# Patient Record
Sex: Male | Born: 1955
Health system: Southern US, Community
[De-identification: ages and names within clinical notes are randomized; demographics above are authoritative.]

## PROBLEM LIST (undated history)

## (undated) DIAGNOSIS — K219 Gastro-esophageal reflux disease without esophagitis: Secondary | ICD-10-CM

## (undated) DIAGNOSIS — I10 Essential (primary) hypertension: Secondary | ICD-10-CM

## (undated) DIAGNOSIS — N529 Male erectile dysfunction, unspecified: Secondary | ICD-10-CM

## (undated) DIAGNOSIS — G4700 Insomnia, unspecified: Secondary | ICD-10-CM

## (undated) HISTORY — DX: Male erectile dysfunction, unspecified: N52.9

## (undated) HISTORY — PX: TONSILLECTOMY AND ADENOIDECTOMY: SUR1326

## (undated) HISTORY — DX: Insomnia, unspecified: G47.00

---

## 2002-01-16 ENCOUNTER — Ambulatory Visit (HOSPITAL_COMMUNITY): Admission: RE | Admit: 2002-01-16 | Discharge: 2002-01-16 | Payer: Self-pay | Admitting: *Deleted

## 2002-01-16 ENCOUNTER — Encounter: Payer: Self-pay | Admitting: *Deleted

## 2014-02-06 ENCOUNTER — Encounter: Payer: Self-pay | Admitting: *Deleted

## 2014-04-29 ENCOUNTER — Emergency Department (HOSPITAL_COMMUNITY)
Admission: EM | Admit: 2014-04-29 | Discharge: 2014-04-29 | Disposition: A | Discharge status: Home / Self Care | Payer: BLUE CROSS/BLUE SHIELD | Attending: Emergency Medicine | Admitting: Emergency Medicine

## 2014-04-29 ENCOUNTER — Emergency Department (HOSPITAL_COMMUNITY): Payer: BLUE CROSS/BLUE SHIELD

## 2014-04-29 ENCOUNTER — Encounter (HOSPITAL_COMMUNITY): Payer: Self-pay

## 2014-04-29 DIAGNOSIS — Z87891 Personal history of nicotine dependence: Secondary | ICD-10-CM | POA: Insufficient documentation

## 2014-04-29 DIAGNOSIS — R202 Paresthesia of skin: Secondary | ICD-10-CM | POA: Insufficient documentation

## 2014-04-29 DIAGNOSIS — Z880 Allergy status to penicillin: Secondary | ICD-10-CM | POA: Insufficient documentation

## 2014-04-29 DIAGNOSIS — G4700 Insomnia, unspecified: Secondary | ICD-10-CM | POA: Insufficient documentation

## 2014-04-29 DIAGNOSIS — Z87438 Personal history of other diseases of male genital organs: Secondary | ICD-10-CM | POA: Insufficient documentation

## 2014-04-29 DIAGNOSIS — I10 Essential (primary) hypertension: Secondary | ICD-10-CM | POA: Insufficient documentation

## 2014-04-29 DIAGNOSIS — Z79899 Other long term (current) drug therapy: Secondary | ICD-10-CM | POA: Insufficient documentation

## 2014-04-29 LAB — CBC
HCT: 41.9 % (ref 39.0–52.0)
Hemoglobin: 14.4 g/dL (ref 13.0–17.0)
MCH: 29.2 pg (ref 26.0–34.0)
MCHC: 34.4 g/dL (ref 30.0–36.0)
MCV: 85 fL (ref 78.0–100.0)
PLATELETS: 241 10*3/uL (ref 150–400)
RBC: 4.93 MIL/uL (ref 4.22–5.81)
RDW: 13.2 % (ref 11.5–15.5)
WBC: 7 10*3/uL (ref 4.0–10.5)

## 2014-04-29 LAB — BASIC METABOLIC PANEL
Anion gap: 9 (ref 5–15)
BUN: 10 mg/dL (ref 6–23)
CHLORIDE: 103 mmol/L (ref 96–112)
CO2: 23 mmol/L (ref 19–32)
Calcium: 9.3 mg/dL (ref 8.4–10.5)
Creatinine, Ser: 0.89 mg/dL (ref 0.50–1.35)
GFR calc Af Amer: >90 mL/min (ref >90)
GFR calc non Af Amer: >90 mL/min (ref >90)
GLUCOSE: 108 mg/dL — AB (ref 70–99)
Potassium: 3.6 mmol/L (ref 3.5–5.1)
Sodium: 135 mmol/L (ref 135–145)

## 2014-04-29 LAB — URINALYSIS, ROUTINE W REFLEX MICROSCOPIC
Bilirubin Urine: NEGATIVE
Glucose, UA: NEGATIVE mg/dL
Hgb urine dipstick: NEGATIVE
Ketones, ur: NEGATIVE mg/dL
LEUKOCYTES UA: NEGATIVE
Nitrite: NEGATIVE
PROTEIN: NEGATIVE mg/dL
Specific Gravity, Urine: 1.005 (ref 1.005–1.030)
UROBILINOGEN UA: 0.2 mg/dL (ref 0.0–1.0)
pH: 6.5 (ref 5.0–8.0)

## 2014-04-29 LAB — I-STAT TROPONIN, ED: TROPONIN I, POC: 0 ng/mL (ref 0.00–0.08)

## 2014-04-29 LAB — MAGNESIUM: Magnesium: 2 mg/dL (ref 1.5–2.5)

## 2014-04-29 NOTE — Discharge Instructions (Signed)
Please follow with your primary care doctor in the next 2 days for a check-up. They must obtain records for further management.  ° °Do not hesitate to return to the Emergency Department for any new, worsening or concerning symptoms.  ° °

## 2014-04-29 NOTE — ED Notes (Addendum)
Pt presents with 2 day h/o "heat" to arms and now into chest.  Pt reports sweat palms today; denies shortness of breath.  Pt reports bilateral big toe pain, denies any discoloration or swelling in toes or legs.  Pt reports "I think my face is flushed".  Pt reports he began to take lexapro x 2 weeks ago after being off med since December.  Pt reports he walked 2 miles this morning, walked stairs at work reports activity did not impact heat sensation, did not make him short of breath.

## 2014-04-29 NOTE — ED Provider Notes (Signed)
CSN: [id number]     Arrival date & time 04/29/14  1141 History   First MD Initiated Contact with Patient 04/29/14 1201     No chief complaint on file.    (Consider location/radiation/quality/duration/timing/severity/associated sxs/prior Treatment) HPI  Patrick Griffin is a 59 y.o. male with past medical history significant for hypertension complaining of sensation of warmth to bilateral upper extremities and anterior chest onset 3 days ago. Patient denies any new medications or supplements, chest pain, shortness of breath, cough, similar prior episodes, cervicalgia, numbness, weakness. He reports that he had a erythematous rash to the anterior chest yesterday which is now resolved. Endorses chills on review of systems. Patient states that he self DC'd Lexapro at the end of December and recently restarted, approximately 2 weeks ago.  Past Medical History  Diagnosis Date  . Insomnia   . ED (erectile dysfunction)    Past Surgical History  Procedure Laterality Date  . Tonsillectomy and adenoidectomy     Family History  Problem Relation Age of Onset  . Cancer Mother   . Bipolar disorder Mother   . Melanoma Father    History  Substance Use Topics  . Smoking status: Former Games developer  . Smokeless tobacco: Not on file  . Alcohol Use: No    Review of Systems  10 systems reviewed and found to be negative, except as noted in the HPI.  Allergies  Penicillins and Shellfish allergy  Home Medications   Prior to Admission medications   Medication Sig Start Date End Date Taking? Authorizing Provider  escitalopram (LEXAPRO) 10 MG tablet Take 10 mg by mouth daily.    Historical Provider, MD  losartan (COZAAR) 50 MG tablet Take 50 mg by mouth daily.    Historical Provider, MD  zolpidem (AMBIEN CR) 12.5 MG CR tablet Take 12.5 mg by mouth at bedtime as needed for sleep.    Historical Provider, MD   BP 145/91 mmHg  Pulse 92  Temp(Src) 98.3 F (36.8 C) (Oral)  Resp 18  Ht  (1.854 m)  Wt  197 lb (89.359 kg)  BMI 26.00 kg/m2  SpO2 98% Physical Exam  Constitutional: He is oriented to person, place, and time. He appears well-developed and well-nourished. No distress.  HENT:  Head: Normocephalic.  Mouth/Throat: Oropharynx is clear and moist.  Eyes: Conjunctivae and EOM are normal. Pupils are equal, round, and reactive to light.  Neck: Normal range of motion. Neck supple. No JVD present.  Cardiovascular: Normal rate, regular rhythm and intact distal pulses.   Pulmonary/Chest: Effort normal and breath sounds normal. No stridor. No respiratory distress. He has no wheezes. He has no rales. He exhibits no tenderness.  Abdominal: Soft. Bowel sounds are normal. He exhibits no distension and no mass. There is no tenderness. There is no rebound and no guarding.  Musculoskeletal: Normal range of motion. He exhibits no edema or tenderness.  Neurological: He is alert and oriented to person, place, and time.  Follows commands, Clear, goal oriented speech, Strength is 5 out of 5x4 extremities, patient ambulates with a coordinated in nonantalgic gait. Sensation is grossly intact.   Skin: No rash noted. No erythema.  Psychiatric: He has a normal mood and affect.  Nursing note and vitals reviewed.   ED Course  Procedures (including critical care time) Labs Review Labs Reviewed - No data to display  Imaging Review No results found.   EKG Interpretation None      MDM   Final diagnoses:  Paresthesia  Filed Vitals:   04/29/14 1153 04/29/14 1215 04/29/14 1300 04/29/14 1323  BP: 145/91 149/93 140/89 139/92  Pulse: 92 100 83 89  Temp: 98.3 F (36.8 C)     TempSrc: Oral     Resp: 18 18 16 18   Height: 6\' 1"  (1.854 m)     Weight: 197 lb (89.359 kg)     SpO2: 98% 98% 99% 100%     [name] is a pleasant 59 y.o. male presenting with sensation of warmth to bilateral upper extremities and chest. This is isolated except for rash which has resolved and occasional  chills.  General cardia and left for electrolyte workup is negative. Doubt this is ACS, there does not appear to be any other neuro deficits. I've advised patient to continue taking Lexapro and follow closely with his primary care physician we've had an extensive discussion of return precautions.  Discussed case with attending MD who agrees with plan and stability to d/c to home.   Evaluation does not show pathology that would require ongoing emergent intervention or inpatient treatment. Pt is hemodynamically stable and mentating appropriately. Discussed findings and plan with patient/guardian, who agrees with care plan. All questions answered. Return precautions discussed and outpatient follow up given.       Wynetta Emeryicole Honi Name, PA-C 04/29/14 1542  Nelia Shiobert L Beaton, MD 04/30/14 [phone]

## 2017-03-15 ENCOUNTER — Emergency Department (HOSPITAL_COMMUNITY): Payer: BLUE CROSS/BLUE SHIELD

## 2017-03-15 ENCOUNTER — Emergency Department (HOSPITAL_COMMUNITY)
Admission: EM | Admit: 2017-03-15 | Discharge: 2017-03-16 | Disposition: A | Discharge status: Home / Self Care | Payer: BLUE CROSS/BLUE SHIELD | Attending: Emergency Medicine | Admitting: Emergency Medicine

## 2017-03-15 DIAGNOSIS — Z87891 Personal history of nicotine dependence: Secondary | ICD-10-CM | POA: Diagnosis not present

## 2017-03-15 DIAGNOSIS — R471 Dysarthria and anarthria: Secondary | ICD-10-CM | POA: Diagnosis not present

## 2017-03-15 DIAGNOSIS — R413 Other amnesia: Secondary | ICD-10-CM | POA: Diagnosis not present

## 2017-03-15 DIAGNOSIS — Z91040 Latex allergy status: Secondary | ICD-10-CM | POA: Diagnosis not present

## 2017-03-15 DIAGNOSIS — Z79899 Other long term (current) drug therapy: Secondary | ICD-10-CM | POA: Diagnosis not present

## 2017-03-15 DIAGNOSIS — R4781 Slurred speech: Secondary | ICD-10-CM | POA: Diagnosis present

## 2017-03-15 DIAGNOSIS — G4700 Insomnia, unspecified: Secondary | ICD-10-CM | POA: Diagnosis not present

## 2017-03-15 DIAGNOSIS — R531 Weakness: Secondary | ICD-10-CM | POA: Insufficient documentation

## 2017-03-15 LAB — COMPREHENSIVE METABOLIC PANEL
ALK PHOS: 76 U/L (ref 38–126)
ALT: 16 U/L — ABNORMAL LOW (ref 17–63)
ANION GAP: 9 (ref 5–15)
AST: 28 U/L (ref 15–41)
Albumin: 3.7 g/dL (ref 3.5–5.0)
BILIRUBIN TOTAL: 0.7 mg/dL (ref 0.3–1.2)
BUN: 18 mg/dL (ref 6–20)
CALCIUM: 8.8 mg/dL — AB (ref 8.9–10.3)
CO2: 22 mmol/L (ref 22–32)
Chloride: 103 mmol/L (ref 101–111)
Creatinine, Ser: 1.01 mg/dL (ref 0.61–1.24)
GFR calc Af Amer: >60 mL/min (ref >60)
GFR calc non Af Amer: >60 mL/min (ref >60)
Glucose, Bld: 145 mg/dL — ABNORMAL HIGH (ref 65–99)
POTASSIUM: 3.6 mmol/L (ref 3.5–5.1)
Sodium: 134 mmol/L — ABNORMAL LOW (ref 135–145)
Total Protein: 6.4 g/dL — ABNORMAL LOW (ref 6.5–8.1)

## 2017-03-15 LAB — CBC
HCT: 42.3 % (ref 39.0–52.0)
Hemoglobin: 13.9 g/dL (ref 13.0–17.0)
MCH: 28 pg (ref 26.0–34.0)
MCHC: 32.9 g/dL (ref 30.0–36.0)
MCV: 85.1 fL (ref 78.0–100.0)
Platelets: 270 10*3/uL (ref 150–400)
RBC: 4.97 MIL/uL (ref 4.22–5.81)
RDW: 13.5 % (ref 11.5–15.5)
WBC: 6.4 10*3/uL (ref 4.0–10.5)

## 2017-03-15 LAB — DIFFERENTIAL
Basophils Absolute: 0.1 10*3/uL (ref 0.0–0.1)
Basophils Relative: 1 %
EOS ABS: 0.3 10*3/uL (ref 0.0–0.7)
Eosinophils Relative: 4 %
LYMPHS PCT: 27 %
Lymphs Abs: 1.7 10*3/uL (ref 0.7–4.0)
MONO ABS: 0.6 10*3/uL (ref 0.1–1.0)
Monocytes Relative: 9 %
Neutro Abs: 3.8 10*3/uL (ref 1.7–7.7)
Neutrophils Relative %: 59 %

## 2017-03-15 LAB — PROTIME-INR
INR: 1.05
Prothrombin Time: 13.6 seconds (ref 11.4–15.2)

## 2017-03-15 LAB — I-STAT CHEM 8, ED
BUN: 22 mg/dL — ABNORMAL HIGH (ref 6–20)
CALCIUM ION: 1.16 mmol/L (ref 1.15–1.40)
Chloride: 101 mmol/L (ref 101–111)
Creatinine, Ser: 0.9 mg/dL (ref 0.61–1.24)
GLUCOSE: 149 mg/dL — AB (ref 65–99)
HCT: 43 % (ref 39.0–52.0)
Hemoglobin: 14.6 g/dL (ref 13.0–17.0)
Potassium: 3.7 mmol/L (ref 3.5–5.1)
Sodium: 138 mmol/L (ref 135–145)
TCO2: 24 mmol/L (ref 22–32)

## 2017-03-15 LAB — I-STAT TROPONIN, ED: Troponin i, poc: 0 ng/mL (ref 0.00–0.08)

## 2017-03-15 LAB — CBG MONITORING, ED
GLUCOSE-CAPILLARY: 104 mg/dL — AB (ref 65–99)
GLUCOSE-CAPILLARY: 87 mg/dL (ref 65–99)

## 2017-03-15 LAB — APTT: aPTT: 31 seconds (ref 24–36)

## 2017-03-15 MED ORDER — LORAZEPAM 2 MG/ML IJ SOLN
0.5000 mg | Freq: Once | INTRAMUSCULAR | Status: DC | PRN
  Filled 2017-03-15: qty 1

## 2017-03-15 NOTE — ED Provider Notes (Signed)
[facility] EMERGENCY DEPARTMENT Provider Note   CSN: [id number] Arrival date & time: 03/15/17  1554     History   Chief Complaint Chief Complaint  Patient presents with  . Aphasia    HPI Patrick Griffin is a 62 y.o. male.  HPI States he has been under increased stress as of late.  Had decreased sleep.  Over the last few days he has been told that his speech is different.  Has been pointed out to him that his S's are slurred.  Patient has empirically noticing changes in his speech.  Denies any focal weakness or numbness.  Denies visual changes. Past Medical History:  Diagnosis Date  . ED (erectile dysfunction)   . Insomnia     There are no active problems to display for this patient.   Past Surgical History:  Procedure Laterality Date  . TONSILLECTOMY AND ADENOIDECTOMY         Home Medications    Prior to Admission medications   Medication Sig Start Date End Date Taking? Authorizing Provider  aspirin-acetaminophen-caffeine (EXCEDRIN MIGRAINE) [phone] MG tablet Take 1 tablet by mouth every 6 (six) hours as needed for headache.   Yes [provider]  escitalopram (LEXAPRO) 10 MG tablet Take 10 mg by mouth daily.   Yes [provider]  losartan (COZAAR) 50 MG tablet Take 50 mg by mouth daily.   Yes [provider]  naproxen sodium (ALEVE) 220 MG tablet Take 220 mg by mouth 2 (two) times daily as needed (headache/pain).   Yes [provider]  omeprazole (PRILOSEC) 20 MG capsule Take 20 mg by mouth daily. 01/26/17  Yes [provider]  sildenafil (REVATIO) 20 MG tablet Take 10 mg by mouth daily as needed (erectile dysfunction).   Yes [provider]    Family History Family History  Problem Relation Age of Onset  . Cancer Mother   . Bipolar disorder Mother   . Melanoma Father     Social History Social History   Tobacco Use  . Smoking status: Former Smoker  Substance Use Topics  .  Alcohol use: No  . Drug use: No     Allergies   Latex; Penicillins; and Salmon [fish allergy]   Review of Systems Review of Systems  Constitutional: Negative for chills and fever.  Eyes: Negative for photophobia and visual disturbance.  Respiratory: Negative for cough and shortness of breath.   Cardiovascular: Negative for chest pain, palpitations and leg swelling.  Gastrointestinal: Negative for abdominal pain, constipation, diarrhea, nausea and vomiting.  Musculoskeletal: Negative for back pain, gait problem, myalgias and neck pain.  Skin: Negative for rash and wound.  Neurological: Positive for speech difficulty and weakness. Negative for dizziness, light-headedness, numbness and headaches.  Psychiatric/Behavioral: Positive for sleep disturbance.  All other systems reviewed and are negative.    Physical Exam Updated Vital Signs BP (!) 135/96   Pulse 65   Temp 98.1 F (36.7 C) (Oral)   Resp 14   SpO2 99%   Physical Exam  Constitutional: He is oriented to person, place, and time. He appears well-developed and well-nourished. No distress.  HENT:  Head: Normocephalic and atraumatic.  Mouth/Throat: Oropharynx is clear and moist. No oropharyngeal exudate.  Eyes: EOM are normal. Pupils are equal, round, and reactive to light.  Neck: Normal range of motion. Neck supple.  Cardiovascular: Normal rate and regular rhythm. Exam reveals no gallop and no friction rub.  No murmur heard. Pulmonary/Chest: Effort normal and breath  sounds normal. No stridor. No respiratory distress. He has no wheezes. He has no rales. He exhibits no tenderness.  Abdominal: Soft. Bowel sounds are normal. There is no tenderness. There is no rebound and no guarding.  Musculoskeletal: Normal range of motion. He exhibits no edema or tenderness.  Neurological: He is alert and oriented to person, place, and time.  Patient is alert and oriented x3 with clear, goal oriented speech. Patient has 5/5 motor in all  extremities. Sensation is intact to light touch. Bilateral finger-to-nose is normal with no signs of dysmetria. Patient has a normal gait and walks without assistance.  Skin: Skin is warm and dry. No rash noted. He is not diaphoretic. No erythema.  Psychiatric: His behavior is normal.  Flat affect  Nursing note and vitals reviewed.    ED Treatments / Results  Labs (all labs ordered are listed, but only abnormal results are displayed) Labs Reviewed  COMPREHENSIVE METABOLIC PANEL - Abnormal; Notable for the following components:      Result Value   Sodium 134 (*)    Glucose, Bld 145 (*)    Calcium 8.8 (*)    Total Protein 6.4 (*)    ALT 16 (*)    All other components within normal limits  CBG MONITORING, ED - Abnormal; Notable for the following components:   Glucose-Capillary 104 (*)    All other components within normal limits  I-STAT CHEM 8, ED - Abnormal; Notable for the following components:   BUN 22 (*)    Glucose, Bld 149 (*)    All other components within normal limits  PROTIME-INR  APTT  CBC  DIFFERENTIAL  I-STAT TROPONIN, ED  CBG MONITORING, ED    EKG  EKG Interpretation  Date/Time:  Friday March 15 2017 16:05:48 EST Ventricular Rate:  86 PR Interval:  172 QRS Duration: 78 QT Interval:  352 QTC Calculation: 421 R Axis:   60 Text Interpretation:  Normal sinus rhythm Normal ECG Confirmed by Loren RacerYelverton, Willi Borowiak ([id number]) on 03/15/2017 5:44:49 PM       Radiology No results found.  Procedures Procedures (including critical care time)  Medications Ordered in ED Medications - No data to display   Initial Impression / Assessment and Plan / ED Course  I have reviewed the triage vital signs and the nursing notes.  Pertinent labs & imaging results that were available during my care of the patient were reviewed by me and considered in my medical decision making (see chart for details).     Discussed with neurology who recommends MRI. Signed out to oncoming  emergency physician pending MRI. Final Clinical Impressions(s) / ED Diagnoses   Final diagnoses:  Dysarthria    ED Discharge Orders    None       Loren RacerYelverton, Kenon Delashmit, MD 03/19/17 [phone]

## 2017-03-15 NOTE — ED Notes (Signed)
Pt requesting to leave, Dr. Ranae PalmsYelverton notified

## 2017-03-15 NOTE — ED Triage Notes (Signed)
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

## 2017-03-16 ENCOUNTER — Emergency Department (HOSPITAL_COMMUNITY): Payer: BLUE CROSS/BLUE SHIELD

## 2017-03-16 NOTE — Discharge Instructions (Signed)
You were seen in the emergency department for changes in your speech.  Your labs, EKG and MRI of your brain today were normal.  No sign of stroke on MRI.  I recommend close follow-up with your primary care physician if symptoms continue.

## 2017-03-16 NOTE — ED Notes (Signed)
Signature pad broken pt stable and ambulatory for d/c. States understanding d/c instructions

## 2017-03-16 NOTE — ED Provider Notes (Signed)
12:00 AM  Assumed care from Dr. Ranae PalmsYelverton.  Patient is a 62 year old male who presents emergency department with slurred speech for the past several days.  CT of the head showed subtle area of hypoattenuation in the left frontal lobe which may be artifact versus age-indeterminate ischemic infarction.  MRI of the brain ordered.  Neurology has been negative, patient will be discharged home.  Normal neurologic exam here in the emergency department.   1:20 AM  Pt's brain MRI is negative.  Will discharge home.  He has no slurred speech or aphasia on exam.  He is comfortable with this plan at and can follow-up with his outpatient provider.  Labs, EKG unremarkable.   At this time, I do not feel there is any life-threatening condition present. I have reviewed and discussed all results (EKG, imaging, lab, urine as appropriate) and exam findings with patient/family. I have reviewed nursing notes and appropriate previous records.  I feel the patient is safe to be discharged home without further emergent workup and can continue workup as an outpatient as needed. Discussed usual and customary return precautions. Patient/family verbalize understanding and are comfortable with this plan.  Outpatient follow-up has been provided if needed. All questions have been answered.    Patrick Griffin, Patrick MawKristen N, DO 03/16/17 0122

## 2018-10-01 ENCOUNTER — Encounter (HOSPITAL_COMMUNITY): Payer: Self-pay | Admitting: Emergency Medicine

## 2018-10-01 ENCOUNTER — Other Ambulatory Visit: Payer: Self-pay

## 2018-10-01 ENCOUNTER — Emergency Department (HOSPITAL_COMMUNITY)
Admission: EM | Admit: 2018-10-01 | Discharge: 2018-10-01 | Disposition: A | Discharge status: Home / Self Care | Payer: Managed Care, Other (non HMO) | Attending: Emergency Medicine | Admitting: Emergency Medicine

## 2018-10-01 DIAGNOSIS — I10 Essential (primary) hypertension: Secondary | ICD-10-CM | POA: Insufficient documentation

## 2018-10-01 DIAGNOSIS — Z87891 Personal history of nicotine dependence: Secondary | ICD-10-CM | POA: Insufficient documentation

## 2018-10-01 DIAGNOSIS — H9203 Otalgia, bilateral: Secondary | ICD-10-CM

## 2018-10-01 DIAGNOSIS — R519 Headache, unspecified: Secondary | ICD-10-CM

## 2018-10-01 DIAGNOSIS — U071 COVID-19: Secondary | ICD-10-CM | POA: Insufficient documentation

## 2018-10-01 DIAGNOSIS — Z91040 Latex allergy status: Secondary | ICD-10-CM | POA: Insufficient documentation

## 2018-10-01 DIAGNOSIS — R51 Headache: Secondary | ICD-10-CM | POA: Diagnosis present

## 2018-10-01 DIAGNOSIS — Z79899 Other long term (current) drug therapy: Secondary | ICD-10-CM | POA: Diagnosis not present

## 2018-10-01 HISTORY — DX: Gastro-esophageal reflux disease without esophagitis: K21.9

## 2018-10-01 HISTORY — DX: Essential (primary) hypertension: I10

## 2018-10-01 NOTE — Discharge Instructions (Signed)
Try Flonase or Afrin over the counter Continue Ibuprofen or Tylenol for pain and fever You can also try Benadryl for congestion and allergies Please continue to self-isolate, rest, and drink fluids. Return if worsening

## 2018-10-01 NOTE — ED Provider Notes (Signed)
[facility]-EMERGENCY DEPT Provider Note   CSN: [id number] Arrival date & time: 10/01/18  1037     History   Chief Complaint Chief Complaint  Patient presents with  . Covid +  . Headache  . Otalgia    HPI Patrick Griffin is a 63 y.o. male who presents with bilateral ear pain.  Patient was recently diagnosed with COVID-19 and he states that since then he has felt severe fatigue and over the past couple days he has a "searing" headache over the bilateral temples and bilateral ear pain.  He feels like this is due to congestion.  He has not taken anything for his symptoms.  He tried to go to his primary doctor but they would not see him because of COVID.  He denies chest pain, shortness of breath, coughing, abdominal pain, nausea or diarrhea.  Additionally he reports a rash on his back.  He states it is mildly itchy.  No new soaps, lotions, detergents.     HPI  Past Medical History:  Diagnosis Date  . Acid reflux   . ED (erectile dysfunction)   . Hypertension   . Insomnia     There are no active problems to display for this patient.   Past Surgical History:  Procedure Laterality Date  . TONSILLECTOMY AND ADENOIDECTOMY          Home Medications    Prior to Admission medications   Medication Sig Start Date End Date Taking? Authorizing Provider  aspirin-acetaminophen-caffeine (EXCEDRIN MIGRAINE) [phone] MG tablet Take 1 tablet by mouth every 6 (six) hours as needed for headache.    [provider]  escitalopram (LEXAPRO) 10 MG tablet Take 10 mg by mouth daily.    [provider]  losartan (COZAAR) 50 MG tablet Take 50 mg by mouth daily.    [provider]  naproxen sodium (ALEVE) 220 MG tablet Take 220 mg by mouth 2 (two) times daily as needed (headache/pain).    [provider]  omeprazole (PRILOSEC) 20 MG capsule Take 20 mg by mouth daily. 01/26/17   [provider]  sildenafil (REVATIO) 20 MG tablet Take  10 mg by mouth daily as needed (erectile dysfunction).    [provider]    Family History Family History  Problem Relation Age of Onset  . Cancer Mother   . Bipolar disorder Mother   . Melanoma Father     Social History Social History   Tobacco Use  . Smoking status: Former Games developermoker  . Smokeless tobacco: Never Used  Substance Use Topics  . Alcohol use: Yes  . Drug use: No     Allergies   Latex, Penicillins, and Salmon [fish allergy]   Review of Systems Review of Systems  Constitutional: Positive for fatigue and fever.  HENT: Positive for congestion and ear pain. Negative for sore throat.   Respiratory: Negative for cough and shortness of breath.   Cardiovascular: Negative for chest pain.  Gastrointestinal: Negative for abdominal pain, diarrhea and nausea.  Neurological: Positive for headaches.  All other systems reviewed and are negative.    Physical Exam Updated Vital Signs BP (!) 125/91 (BP Location: Left Arm)   Pulse (!) 107   Temp 100 F (37.8 C) (Oral)   Resp 20   SpO2 98%   Physical Exam Vitals signs and nursing note reviewed.  Constitutional:      General: He is not in acute distress.    Appearance: He is well-developed. He is not  ill-appearing.  HENT:     Head: Normocephalic and atraumatic.     Right Ear: Hearing, tympanic membrane, ear canal and external ear normal.     Left Ear: Hearing, ear canal and external ear normal. A middle ear effusion is present.     Nose: Nose normal.     Mouth/Throat:     Lips: Pink.     Mouth: Mucous membranes are moist.  Eyes:     General: No scleral icterus.       Right eye: No discharge.        Left eye: No discharge.     Conjunctiva/sclera: Conjunctivae normal.     Pupils: Pupils are equal, round, and reactive to light.  Neck:     Musculoskeletal: Normal range of motion.  Cardiovascular:     Rate and Rhythm: Regular rhythm. Tachycardia present.  Pulmonary:     Effort: Pulmonary effort is  normal. No respiratory distress.     Breath sounds: Normal breath sounds.  Abdominal:     General: There is no distension.  Skin:    General: Skin is warm and dry.     Findings: Rash (Diffuse papular rash that looks like folliculitis.) present.  Neurological:     Mental Status: He is alert and oriented to person, place, and time.  Psychiatric:        Behavior: Behavior normal.      ED Treatments / Results  Labs (all labs ordered are listed, but only abnormal results are displayed) Labs Reviewed - No data to display  EKG None  Radiology No results found.  Procedures Procedures (including critical care time)  Medications Ordered in ED Medications - No data to display   Initial Impression / Assessment and Plan / ED Course  I have reviewed the triage vital signs and the nursing notes.  Pertinent labs & imaging results that were available during my care of the patient were reviewed by me and considered in my medical decision making (see chart for details).  63 year old male with COVID-19 presents with bilateral ear pain and a headache.  Temp is elevated and he is mildly tachycardic.  He appears generally fatigued.  HEENT exam reveals a middle ear effusion.  He denies any respiratory symptoms.  Do not think we need a chest x-ray at this time.  O2 sats are 98%.  He is encouraged to get Flonase and antihistamine and continue to self isolate, rest, drink fluids.  Return precautions discussed.  Final Clinical Impressions(s) / ED Diagnoses   Final diagnoses:  Bad headache  Otalgia of both ears    ED Discharge Orders    None       Recardo Evangelist, PA-C 10/01/18 Ansted, Adam, DO 10/01/18 1528

## 2018-10-01 NOTE — ED Triage Notes (Addendum)
Pt reports he was called on Sunday with a Covid + test. C/o headache and ear pains. Traveled to [place] before his symptoms started. Pt reports taken Goody's for his headaches and fevers which will relieve it.

## 2019-06-21 IMAGING — CT CT HEAD W/O CM
4 series · 17 of 47 positions shown, 19 images · non-contrast
Comparison: None.

CLINICAL DATA: Focal neuro deficit for more than 6 hours. Suspected
stroke.

EXAM:
CT HEAD WITHOUT CONTRAST
TECHNIQUE: Contiguous axial images were obtained from the base of the skull
through the vertex without intravenous contrast.

[Series 3: head without · axial · non-contrast · 0.44mm/px · z∈[-96,+29]mm · 7 of 35 slices shown, 9 images]
[im 5/35  brain]
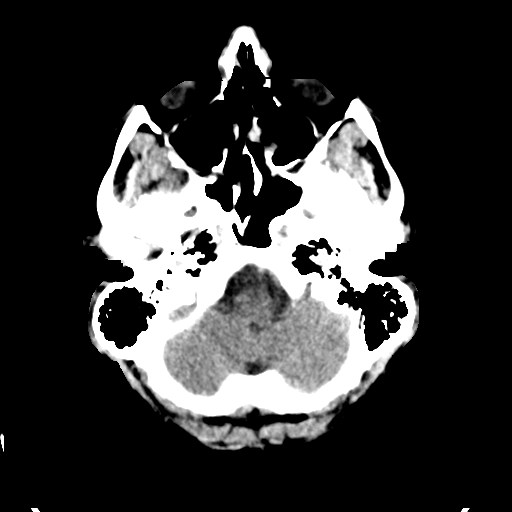
[im 5/35  bone]
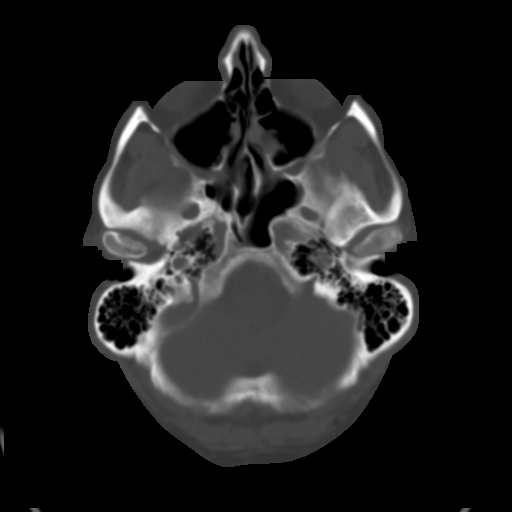
[im 9/35  brain]
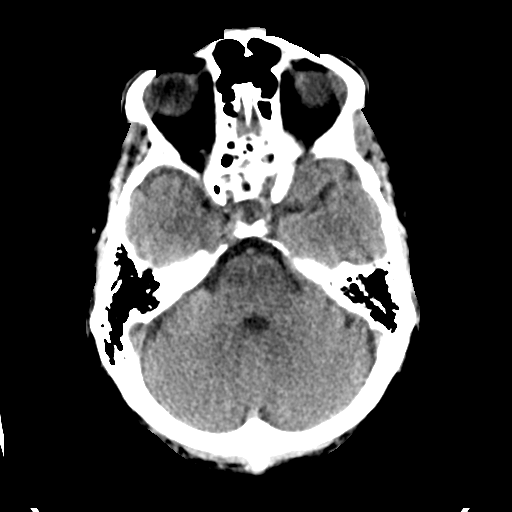
[im 13/35  brain]
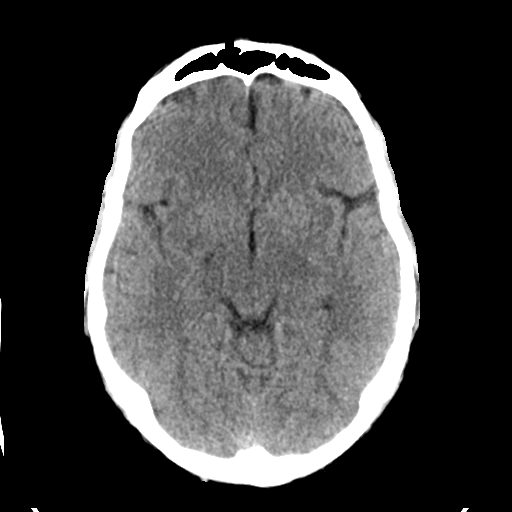
[im 18/35  brain]
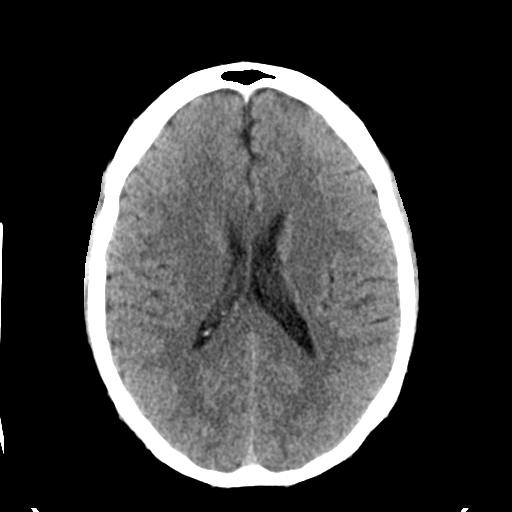
[im 22/35  brain]
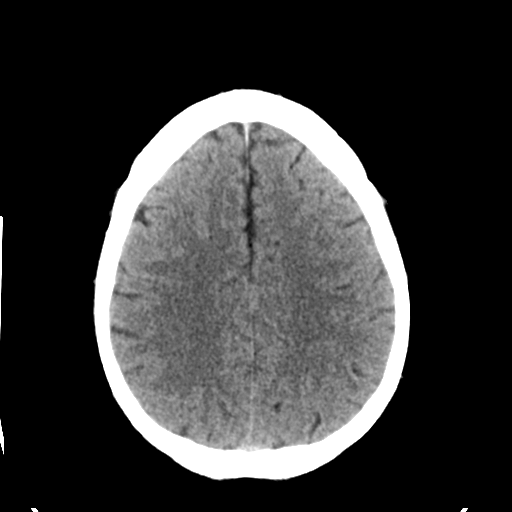
[im 22/35  bone]
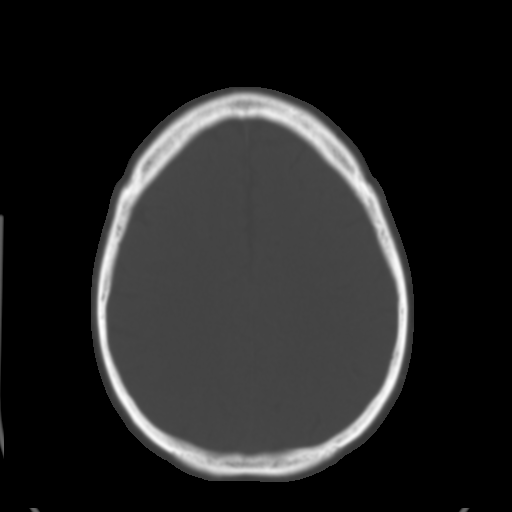
[im 26/35  brain]
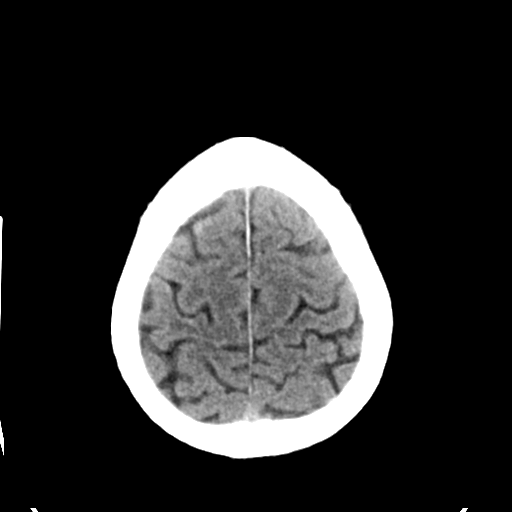
[im 30/35  brain]
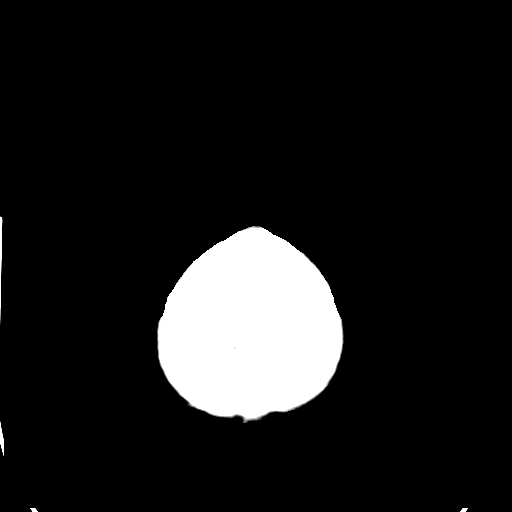

[Series 4: head bone · axial · 0.44mm/px · z∈[-100,-40]mm · 4 of 86 slices shown]
[im 9/86  bone]
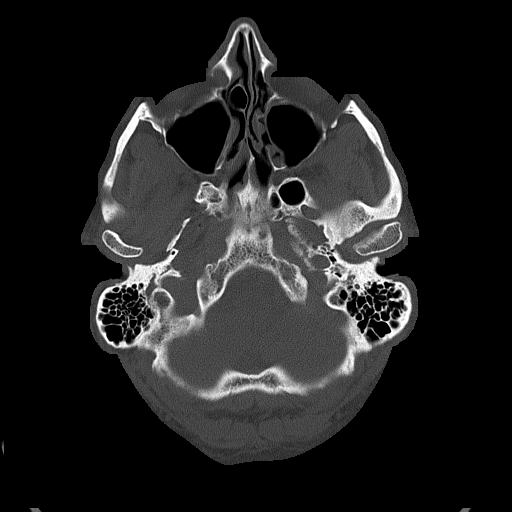
[im 18/86  bone]
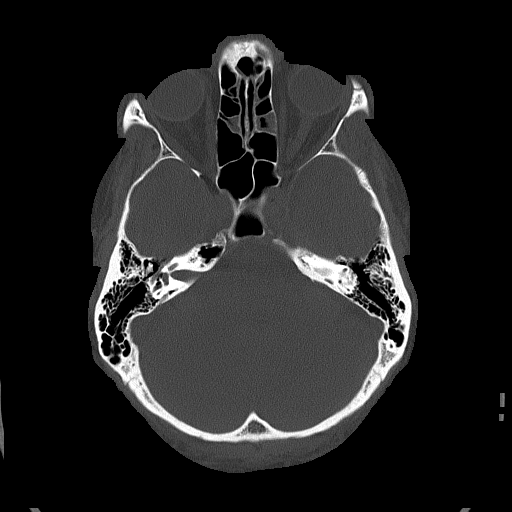
[im 26/86  bone]
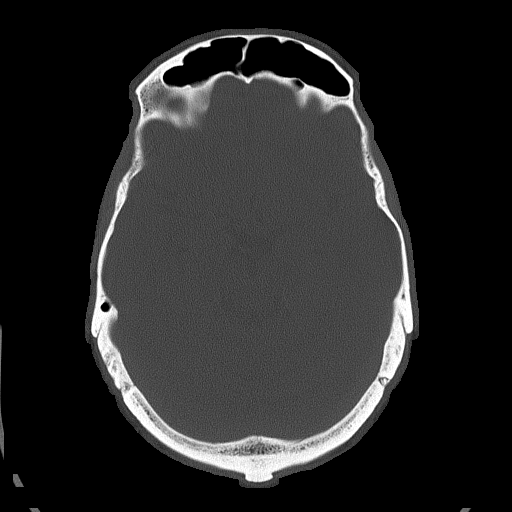
[im 39/86  bone]
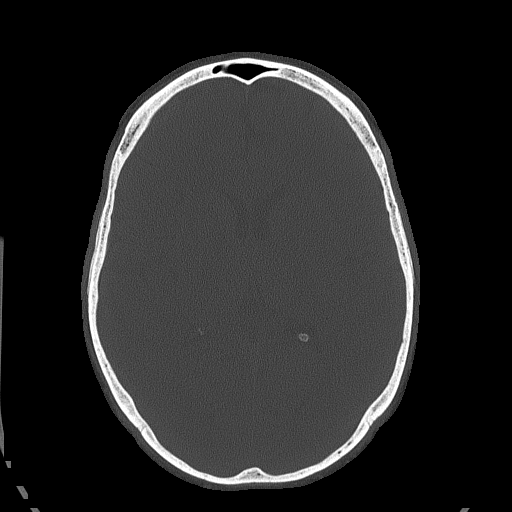

[Series 5: head without cor · coronal · non-contrast · 0.34mm/px · 3 of 70 slices shown]
[im 24/70  brain]
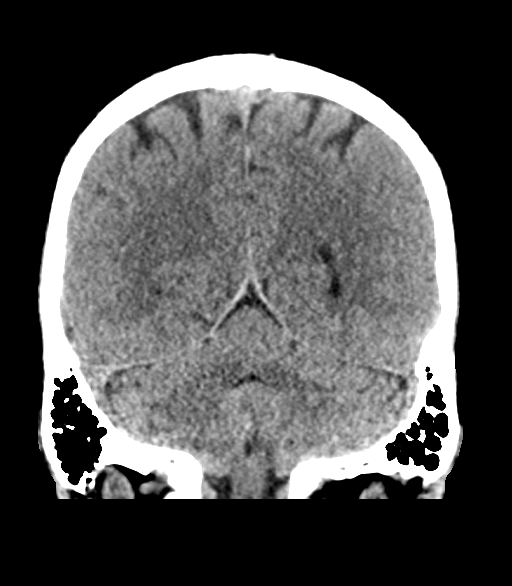
[im 31/70  brain]
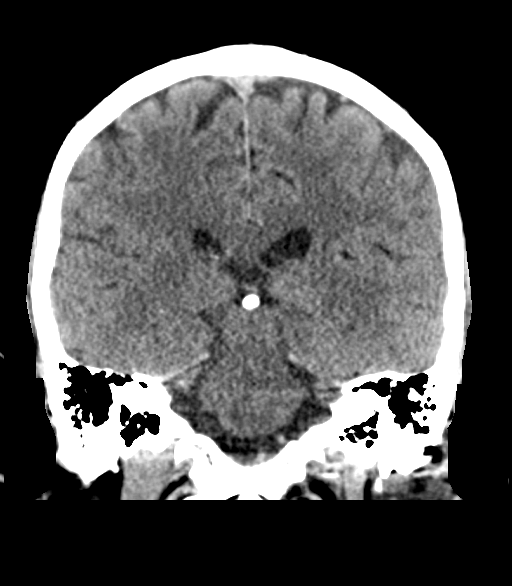
[im 39/70  brain]
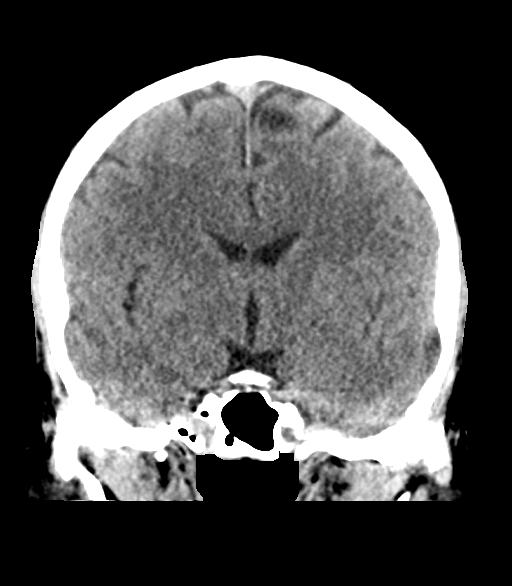

[Series 6: head without sag · sagittal · non-contrast · 0.36mm/px · 3 of 67 slices shown]
[im 23/67  brain]
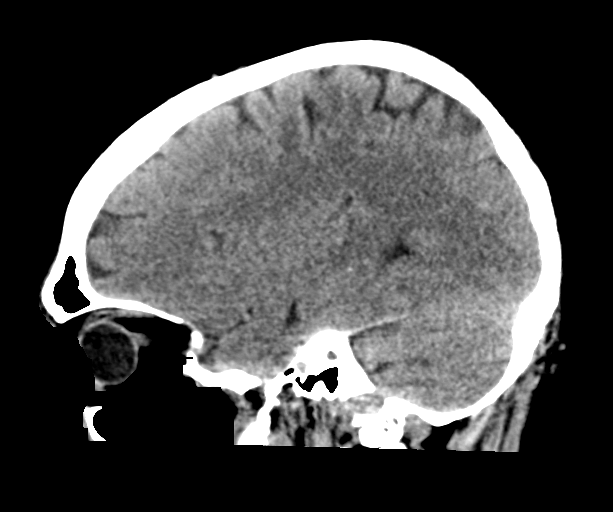
[im 34/67  brain]
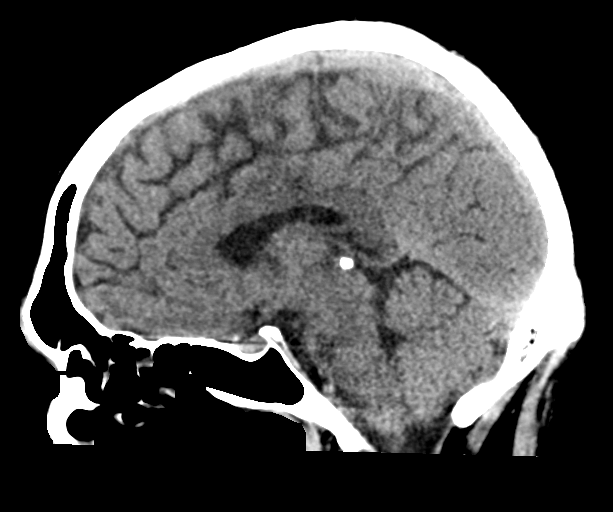
[im 45/67  brain]
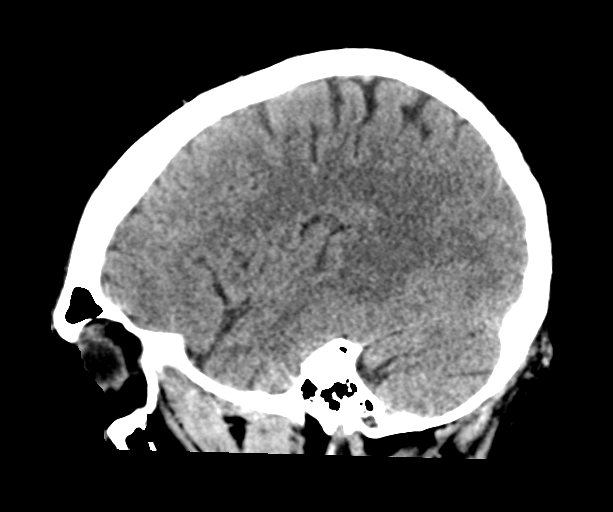

[17 of 47 positions shown; findings below may reference images not displayed]

FINDINGS: Brain: No evidence of acute hemorrhage, hydrocephalus, extra-axial
collection or mass lesion/mass effect. Questionable subtle area of
hypoattenuation in the posterior left frontal lobe in mostly
subcortical distribution. Mild periventricular microangiopathy.

Vascular: No hyperdense vessel or unexpected calcification.

Skull: Normal. Negative for fracture or focal lesion.

Sinuses/Orbits: Partial opacification of the ethmoid sinuses. The
remainder of paranasal sinuses are well aerated.

Other: None.
IMPRESSION: Subtle area of hypoattenuation in the left frontal lobe, which may
be artifactual or represent an age indeterminate ischemic
infarction.

No evidence of intracranial hemorrhage or mass effect.

## 2022-01-16 ENCOUNTER — Other Ambulatory Visit: Payer: Self-pay | Admitting: Family Medicine

## 2022-01-16 DIAGNOSIS — E7800 Pure hypercholesterolemia, unspecified: Secondary | ICD-10-CM | POA: Diagnosis not present

## 2022-01-16 DIAGNOSIS — F324 Major depressive disorder, single episode, in partial remission: Secondary | ICD-10-CM | POA: Diagnosis not present

## 2022-01-16 DIAGNOSIS — Z1331 Encounter for screening for depression: Secondary | ICD-10-CM | POA: Diagnosis not present

## 2022-01-16 DIAGNOSIS — Z0000 Encounter for general adult medical examination without abnormal findings: Secondary | ICD-10-CM | POA: Diagnosis not present

## 2022-01-16 DIAGNOSIS — G4733 Obstructive sleep apnea (adult) (pediatric): Secondary | ICD-10-CM | POA: Diagnosis not present

## 2022-01-16 DIAGNOSIS — K219 Gastro-esophageal reflux disease without esophagitis: Secondary | ICD-10-CM | POA: Diagnosis not present

## 2022-01-16 DIAGNOSIS — Z136 Encounter for screening for cardiovascular disorders: Secondary | ICD-10-CM

## 2022-01-16 DIAGNOSIS — N529 Male erectile dysfunction, unspecified: Secondary | ICD-10-CM | POA: Diagnosis not present

## 2022-01-16 DIAGNOSIS — I10 Essential (primary) hypertension: Secondary | ICD-10-CM | POA: Diagnosis not present

## 2022-01-16 DIAGNOSIS — Z125 Encounter for screening for malignant neoplasm of prostate: Secondary | ICD-10-CM | POA: Diagnosis not present

## 2022-01-29 ENCOUNTER — Ambulatory Visit
Admission: RE | Admit: 2022-01-29 | Discharge: 2022-01-29 | Disposition: A | Discharge status: Home / Self Care | Payer: Medicare HMO | Source: Ambulatory Visit | Attending: Family Medicine | Admitting: Family Medicine

## 2022-01-29 ENCOUNTER — Ambulatory Visit: Payer: Managed Care, Other (non HMO)

## 2022-01-29 DIAGNOSIS — Z136 Encounter for screening for cardiovascular disorders: Secondary | ICD-10-CM

## 2022-01-29 DIAGNOSIS — Z87891 Personal history of nicotine dependence: Secondary | ICD-10-CM | POA: Diagnosis not present

## 2022-07-16 DIAGNOSIS — G4700 Insomnia, unspecified: Secondary | ICD-10-CM | POA: Diagnosis not present

## 2022-07-16 DIAGNOSIS — I10 Essential (primary) hypertension: Secondary | ICD-10-CM | POA: Diagnosis not present

## 2022-07-16 DIAGNOSIS — N529 Male erectile dysfunction, unspecified: Secondary | ICD-10-CM | POA: Diagnosis not present

## 2022-07-16 DIAGNOSIS — F330 Major depressive disorder, recurrent, mild: Secondary | ICD-10-CM | POA: Diagnosis not present

## 2022-07-16 DIAGNOSIS — K219 Gastro-esophageal reflux disease without esophagitis: Secondary | ICD-10-CM | POA: Diagnosis not present

## 2022-07-16 DIAGNOSIS — G4733 Obstructive sleep apnea (adult) (pediatric): Secondary | ICD-10-CM | POA: Diagnosis not present

## 2022-07-16 DIAGNOSIS — H9190 Unspecified hearing loss, unspecified ear: Secondary | ICD-10-CM | POA: Diagnosis not present

## 2022-07-16 DIAGNOSIS — N400 Enlarged prostate without lower urinary tract symptoms: Secondary | ICD-10-CM | POA: Diagnosis not present

## 2022-07-16 DIAGNOSIS — E7800 Pure hypercholesterolemia, unspecified: Secondary | ICD-10-CM | POA: Diagnosis not present

## 2023-01-25 DIAGNOSIS — Z1159 Encounter for screening for other viral diseases: Secondary | ICD-10-CM | POA: Diagnosis not present

## 2023-01-25 DIAGNOSIS — L819 Disorder of pigmentation, unspecified: Secondary | ICD-10-CM | POA: Diagnosis not present

## 2023-01-25 DIAGNOSIS — Z0000 Encounter for general adult medical examination without abnormal findings: Secondary | ICD-10-CM | POA: Diagnosis not present

## 2023-01-25 DIAGNOSIS — F1090 Alcohol use, unspecified, uncomplicated: Secondary | ICD-10-CM | POA: Diagnosis not present

## 2023-01-25 DIAGNOSIS — Z13220 Encounter for screening for lipoid disorders: Secondary | ICD-10-CM | POA: Diagnosis not present

## 2023-01-25 DIAGNOSIS — F325 Major depressive disorder, single episode, in full remission: Secondary | ICD-10-CM | POA: Diagnosis not present

## 2023-01-25 DIAGNOSIS — I10 Essential (primary) hypertension: Secondary | ICD-10-CM | POA: Diagnosis not present

## 2023-01-25 DIAGNOSIS — K219 Gastro-esophageal reflux disease without esophagitis: Secondary | ICD-10-CM | POA: Diagnosis not present

## 2023-01-25 DIAGNOSIS — Z125 Encounter for screening for malignant neoplasm of prostate: Secondary | ICD-10-CM | POA: Diagnosis not present

## 2023-03-12 DIAGNOSIS — L814 Other melanin hyperpigmentation: Secondary | ICD-10-CM | POA: Diagnosis not present

## 2023-03-12 DIAGNOSIS — D492 Neoplasm of unspecified behavior of bone, soft tissue, and skin: Secondary | ICD-10-CM | POA: Diagnosis not present

## 2023-03-12 DIAGNOSIS — R202 Paresthesia of skin: Secondary | ICD-10-CM | POA: Diagnosis not present

## 2023-03-12 DIAGNOSIS — L538 Other specified erythematous conditions: Secondary | ICD-10-CM | POA: Diagnosis not present

## 2023-03-12 DIAGNOSIS — D225 Melanocytic nevi of trunk: Secondary | ICD-10-CM | POA: Diagnosis not present

## 2023-03-12 DIAGNOSIS — L821 Other seborrheic keratosis: Secondary | ICD-10-CM | POA: Diagnosis not present

## 2023-04-03 DIAGNOSIS — N401 Enlarged prostate with lower urinary tract symptoms: Secondary | ICD-10-CM | POA: Diagnosis not present

## 2023-04-03 DIAGNOSIS — M1990 Unspecified osteoarthritis, unspecified site: Secondary | ICD-10-CM | POA: Diagnosis not present

## 2023-04-03 DIAGNOSIS — Z860100 Personal history of colon polyps, unspecified: Secondary | ICD-10-CM | POA: Diagnosis not present

## 2023-04-03 DIAGNOSIS — E785 Hyperlipidemia, unspecified: Secondary | ICD-10-CM | POA: Diagnosis not present

## 2023-04-03 DIAGNOSIS — E663 Overweight: Secondary | ICD-10-CM | POA: Diagnosis not present

## 2023-04-03 DIAGNOSIS — K219 Gastro-esophageal reflux disease without esophagitis: Secondary | ICD-10-CM | POA: Diagnosis not present

## 2023-04-03 DIAGNOSIS — R2681 Unsteadiness on feet: Secondary | ICD-10-CM | POA: Diagnosis not present

## 2023-04-03 DIAGNOSIS — Z008 Encounter for other general examination: Secondary | ICD-10-CM | POA: Diagnosis not present

## 2023-04-03 DIAGNOSIS — F17211 Nicotine dependence, cigarettes, in remission: Secondary | ICD-10-CM | POA: Diagnosis not present

## 2023-04-03 DIAGNOSIS — Z6825 Body mass index (BMI) 25.0-25.9, adult: Secondary | ICD-10-CM | POA: Diagnosis not present

## 2023-04-03 DIAGNOSIS — I10 Essential (primary) hypertension: Secondary | ICD-10-CM | POA: Diagnosis not present

## 2023-04-03 DIAGNOSIS — F411 Generalized anxiety disorder: Secondary | ICD-10-CM | POA: Diagnosis not present

## 2023-07-22 DIAGNOSIS — R1013 Epigastric pain: Secondary | ICD-10-CM | POA: Diagnosis not present

## 2023-07-22 DIAGNOSIS — F411 Generalized anxiety disorder: Secondary | ICD-10-CM | POA: Diagnosis not present

## 2023-07-22 DIAGNOSIS — I10 Essential (primary) hypertension: Secondary | ICD-10-CM | POA: Diagnosis not present

## 2023-08-07 DIAGNOSIS — K219 Gastro-esophageal reflux disease without esophagitis: Secondary | ICD-10-CM | POA: Diagnosis not present

## 2023-09-11 DIAGNOSIS — K317 Polyp of stomach and duodenum: Secondary | ICD-10-CM | POA: Diagnosis not present

## 2023-09-11 DIAGNOSIS — K2940 Chronic atrophic gastritis without bleeding: Secondary | ICD-10-CM | POA: Diagnosis not present

## 2023-09-11 DIAGNOSIS — K31A19 Gastric intestinal metaplasia without dysplasia, unspecified site: Secondary | ICD-10-CM | POA: Diagnosis not present

## 2023-09-11 DIAGNOSIS — K2930 Chronic superficial gastritis without bleeding: Secondary | ICD-10-CM | POA: Diagnosis not present

## 2023-09-11 DIAGNOSIS — K2289 Other specified disease of esophagus: Secondary | ICD-10-CM | POA: Diagnosis not present

## 2023-09-11 DIAGNOSIS — K219 Gastro-esophageal reflux disease without esophagitis: Secondary | ICD-10-CM | POA: Diagnosis not present

## 2023-09-11 DIAGNOSIS — K3189 Other diseases of stomach and duodenum: Secondary | ICD-10-CM | POA: Diagnosis not present

## 2023-09-11 DIAGNOSIS — K31A15 Gastric intestinal metaplasia without dysplasia, involving multiple sites: Secondary | ICD-10-CM | POA: Diagnosis not present

## 2023-09-18 DIAGNOSIS — K219 Gastro-esophageal reflux disease without esophagitis: Secondary | ICD-10-CM | POA: Diagnosis not present

## 2023-09-18 DIAGNOSIS — K2940 Chronic atrophic gastritis without bleeding: Secondary | ICD-10-CM | POA: Diagnosis not present

## 2023-09-18 DIAGNOSIS — K31A19 Gastric intestinal metaplasia without dysplasia, unspecified site: Secondary | ICD-10-CM | POA: Diagnosis not present

## 2023-11-01 DIAGNOSIS — R413 Other amnesia: Secondary | ICD-10-CM | POA: Diagnosis not present

## 2023-11-01 DIAGNOSIS — F411 Generalized anxiety disorder: Secondary | ICD-10-CM | POA: Diagnosis not present

## 2023-11-01 DIAGNOSIS — R42 Dizziness and giddiness: Secondary | ICD-10-CM | POA: Diagnosis not present

## 2023-11-01 DIAGNOSIS — G250 Essential tremor: Secondary | ICD-10-CM | POA: Diagnosis not present

## 2023-11-05 ENCOUNTER — Other Ambulatory Visit: Payer: Self-pay | Admitting: Family Medicine

## 2023-11-05 DIAGNOSIS — R42 Dizziness and giddiness: Secondary | ICD-10-CM

## 2023-11-11 ENCOUNTER — Encounter: Payer: Self-pay | Admitting: Family Medicine

## 2023-11-12 ENCOUNTER — Ambulatory Visit
Admission: RE | Admit: 2023-11-12 | Discharge: 2023-11-12 | Disposition: A | Discharge status: Home / Self Care | Source: Ambulatory Visit | Attending: Family Medicine | Admitting: Family Medicine

## 2023-11-12 DIAGNOSIS — R413 Other amnesia: Secondary | ICD-10-CM | POA: Diagnosis not present

## 2023-11-12 DIAGNOSIS — R42 Dizziness and giddiness: Secondary | ICD-10-CM

## 2023-11-12 MED ORDER — GADOPICLENOL 0.5 MMOL/ML IV SOLN
9.0000 mL | Freq: Once | INTRAVENOUS | Status: AC | PRN
  Administered 2023-11-12: 9 mL via INTRAVENOUS

## 2024-03-20 ENCOUNTER — Institutional Professional Consult (permissible substitution) (INDEPENDENT_AMBULATORY_CARE_PROVIDER_SITE_OTHER)

## 2024-04-21 ENCOUNTER — Ambulatory Visit: Admitting: Diagnostic Neuroimaging
